# Patient Record
Sex: Female | Born: 1995 | Race: White | Hispanic: No | Marital: Single | Smoking: Never smoker
Health system: Southern US, Community
[De-identification: ages and names within clinical notes are randomized; demographics above are authoritative.]

## PROBLEM LIST (undated history)

## (undated) DIAGNOSIS — K219 Gastro-esophageal reflux disease without esophagitis: Secondary | ICD-10-CM

## (undated) DIAGNOSIS — F909 Attention-deficit hyperactivity disorder, unspecified type: Secondary | ICD-10-CM

## (undated) DIAGNOSIS — G90A Postural orthostatic tachycardia syndrome (POTS): Secondary | ICD-10-CM

## (undated) DIAGNOSIS — R000 Tachycardia, unspecified: Principal | ICD-10-CM

## (undated) DIAGNOSIS — J45909 Unspecified asthma, uncomplicated: Secondary | ICD-10-CM

## (undated) DIAGNOSIS — I498 Other specified cardiac arrhythmias: Secondary | ICD-10-CM

## (undated) DIAGNOSIS — I951 Orthostatic hypotension: Principal | ICD-10-CM

## (undated) DIAGNOSIS — Z8742 Personal history of other diseases of the female genital tract: Secondary | ICD-10-CM

## (undated) DIAGNOSIS — F329 Major depressive disorder, single episode, unspecified: Secondary | ICD-10-CM

## (undated) DIAGNOSIS — F419 Anxiety disorder, unspecified: Secondary | ICD-10-CM

## (undated) HISTORY — DX: Major depressive disorder, single episode, unspecified: F32.9

## (undated) HISTORY — DX: Gastro-esophageal reflux disease without esophagitis: K21.9

## (undated) HISTORY — DX: Orthostatic hypotension: I95.1

## (undated) HISTORY — DX: Unspecified asthma, uncomplicated: J45.909

## (undated) HISTORY — DX: Other specified cardiac arrhythmias: I49.8

## (undated) HISTORY — DX: Attention-deficit hyperactivity disorder, unspecified type: F90.9

## (undated) HISTORY — DX: Anxiety disorder, unspecified: F41.9

## (undated) HISTORY — DX: Postural orthostatic tachycardia syndrome (POTS): G90.A

## (undated) HISTORY — DX: Personal history of other diseases of the female genital tract: Z87.42

## (undated) HISTORY — DX: Tachycardia, unspecified: R00.0

---
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
## 2016-02-15 DIAGNOSIS — R300 Dysuria: Secondary | ICD-10-CM | POA: Diagnosis not present

## 2016-02-15 DIAGNOSIS — N390 Urinary tract infection, site not specified: Secondary | ICD-10-CM | POA: Diagnosis not present

## 2016-02-15 DIAGNOSIS — R319 Hematuria, unspecified: Secondary | ICD-10-CM | POA: Diagnosis not present

## 2016-03-27 DIAGNOSIS — K529 Noninfective gastroenteritis and colitis, unspecified: Secondary | ICD-10-CM | POA: Diagnosis not present

## 2016-04-14 DIAGNOSIS — J069 Acute upper respiratory infection, unspecified: Secondary | ICD-10-CM | POA: Diagnosis not present

## 2016-04-14 DIAGNOSIS — R05 Cough: Secondary | ICD-10-CM | POA: Diagnosis not present

## 2016-05-18 DIAGNOSIS — Z23 Encounter for immunization: Secondary | ICD-10-CM | POA: Diagnosis not present

## 2016-05-18 DIAGNOSIS — Z0000 Encounter for general adult medical examination without abnormal findings: Secondary | ICD-10-CM | POA: Diagnosis not present

## 2016-05-18 DIAGNOSIS — J329 Chronic sinusitis, unspecified: Secondary | ICD-10-CM | POA: Diagnosis not present

## 2016-05-22 ENCOUNTER — Emergency Department (HOSPITAL_COMMUNITY)
Admission: EM | Admit: 2016-05-22 | Discharge: 2016-05-22 | Disposition: A | Discharge status: Home / Self Care | Payer: BLUE CROSS/BLUE SHIELD | Attending: Emergency Medicine | Admitting: Emergency Medicine

## 2016-05-22 ENCOUNTER — Encounter (HOSPITAL_COMMUNITY): Payer: Self-pay | Admitting: Pharmacy Technician

## 2016-05-22 ENCOUNTER — Emergency Department (HOSPITAL_COMMUNITY): Payer: BLUE CROSS/BLUE SHIELD

## 2016-05-22 DIAGNOSIS — Y92410 Unspecified street and highway as the place of occurrence of the external cause: Secondary | ICD-10-CM | POA: Insufficient documentation

## 2016-05-22 DIAGNOSIS — F909 Attention-deficit hyperactivity disorder, unspecified type: Secondary | ICD-10-CM | POA: Insufficient documentation

## 2016-05-22 DIAGNOSIS — M545 Low back pain: Secondary | ICD-10-CM | POA: Diagnosis not present

## 2016-05-22 DIAGNOSIS — R102 Pelvic and perineal pain: Secondary | ICD-10-CM | POA: Diagnosis not present

## 2016-05-22 DIAGNOSIS — S60512A Abrasion of left hand, initial encounter: Secondary | ICD-10-CM | POA: Insufficient documentation

## 2016-05-22 DIAGNOSIS — S60511A Abrasion of right hand, initial encounter: Secondary | ICD-10-CM | POA: Insufficient documentation

## 2016-05-22 DIAGNOSIS — Y999 Unspecified external cause status: Secondary | ICD-10-CM | POA: Insufficient documentation

## 2016-05-22 DIAGNOSIS — M546 Pain in thoracic spine: Secondary | ICD-10-CM | POA: Diagnosis not present

## 2016-05-22 DIAGNOSIS — S0990XA Unspecified injury of head, initial encounter: Secondary | ICD-10-CM | POA: Diagnosis present

## 2016-05-22 DIAGNOSIS — S0081XA Abrasion of other part of head, initial encounter: Secondary | ICD-10-CM | POA: Diagnosis not present

## 2016-05-22 DIAGNOSIS — Y939 Activity, unspecified: Secondary | ICD-10-CM | POA: Diagnosis not present

## 2016-05-22 LAB — POC URINE PREG, ED: PREG TEST UR: NEGATIVE

## 2016-05-22 MED ORDER — IBUPROFEN 400 MG PO TABS: 400.0000 mg | ORAL_TABLET | Freq: Four times a day (QID) | ORAL | 0 refills | Status: DC | PRN

## 2016-05-22 MED ORDER — KETOROLAC TROMETHAMINE 60 MG/2ML IM SOLN
60.0000 mg | Freq: Once | INTRAMUSCULAR | Status: AC
  Administered 2016-05-22: 60 mg via INTRAMUSCULAR
  Filled 2016-05-22: qty 2

## 2016-05-22 MED ORDER — HYDROCODONE-ACETAMINOPHEN 5-325 MG PO TABS
1.0000 | ORAL_TABLET | Freq: Once | ORAL | Status: AC
  Administered 2016-05-22: 1 via ORAL
  Filled 2016-05-22: qty 1

## 2016-05-22 NOTE — ED Notes (Signed)
Patient has returned from being out of the department; patient placed back on continuous pulse oximetry and blood pressure cuff; visitors at bedside 
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
## 2016-06-08 DIAGNOSIS — R1900 Intra-abdominal and pelvic swelling, mass and lump, unspecified site: Secondary | ICD-10-CM | POA: Diagnosis not present

## 2016-09-18 DIAGNOSIS — M67442 Ganglion, left hand: Secondary | ICD-10-CM | POA: Diagnosis not present

## 2016-09-26 DIAGNOSIS — Z6834 Body mass index (BMI) 34.0-34.9, adult: Secondary | ICD-10-CM | POA: Diagnosis not present

## 2016-09-26 DIAGNOSIS — R875 Abnormal microbiological findings in specimens from female genital organs: Secondary | ICD-10-CM | POA: Diagnosis not present

## 2016-09-26 DIAGNOSIS — Z01419 Encounter for gynecological examination (general) (routine) without abnormal findings: Secondary | ICD-10-CM | POA: Diagnosis not present

## 2016-10-04 DIAGNOSIS — M67442 Ganglion, left hand: Secondary | ICD-10-CM | POA: Diagnosis not present

## 2016-10-16 DIAGNOSIS — L259 Unspecified contact dermatitis, unspecified cause: Secondary | ICD-10-CM | POA: Diagnosis not present

## 2016-10-16 DIAGNOSIS — J069 Acute upper respiratory infection, unspecified: Secondary | ICD-10-CM | POA: Diagnosis not present

## 2016-10-29 ENCOUNTER — Other Ambulatory Visit: Payer: Self-pay

## 2016-10-29 DIAGNOSIS — L729 Follicular cyst of the skin and subcutaneous tissue, unspecified: Secondary | ICD-10-CM | POA: Diagnosis not present

## 2016-10-29 DIAGNOSIS — M67442 Ganglion, left hand: Secondary | ICD-10-CM | POA: Diagnosis not present

## 2016-11-05 DIAGNOSIS — N920 Excessive and frequent menstruation with regular cycle: Secondary | ICD-10-CM | POA: Diagnosis not present

## 2016-11-05 DIAGNOSIS — R21 Rash and other nonspecific skin eruption: Secondary | ICD-10-CM | POA: Diagnosis not present

## 2016-12-19 DIAGNOSIS — L409 Psoriasis, unspecified: Secondary | ICD-10-CM | POA: Diagnosis not present

## 2016-12-31 DIAGNOSIS — Z79899 Other long term (current) drug therapy: Secondary | ICD-10-CM | POA: Diagnosis not present

## 2017-02-04 DIAGNOSIS — L409 Psoriasis, unspecified: Secondary | ICD-10-CM | POA: Diagnosis not present

## 2017-03-11 DIAGNOSIS — M546 Pain in thoracic spine: Secondary | ICD-10-CM | POA: Diagnosis not present

## 2017-03-21 DIAGNOSIS — R0781 Pleurodynia: Secondary | ICD-10-CM | POA: Diagnosis not present

## 2017-03-21 DIAGNOSIS — M62830 Muscle spasm of back: Secondary | ICD-10-CM | POA: Diagnosis not present

## 2017-04-04 DIAGNOSIS — R110 Nausea: Secondary | ICD-10-CM | POA: Diagnosis not present

## 2017-04-04 DIAGNOSIS — N926 Irregular menstruation, unspecified: Secondary | ICD-10-CM | POA: Diagnosis not present

## 2017-04-08 DIAGNOSIS — N926 Irregular menstruation, unspecified: Secondary | ICD-10-CM | POA: Diagnosis not present

## 2017-04-11 DIAGNOSIS — R3915 Urgency of urination: Secondary | ICD-10-CM | POA: Diagnosis not present

## 2017-04-11 DIAGNOSIS — N939 Abnormal uterine and vaginal bleeding, unspecified: Secondary | ICD-10-CM | POA: Diagnosis not present

## 2017-04-11 DIAGNOSIS — R632 Polyphagia: Secondary | ICD-10-CM | POA: Diagnosis not present

## 2017-05-01 DIAGNOSIS — Z113 Encounter for screening for infections with a predominantly sexual mode of transmission: Secondary | ICD-10-CM | POA: Diagnosis not present

## 2017-05-01 DIAGNOSIS — N938 Other specified abnormal uterine and vaginal bleeding: Secondary | ICD-10-CM | POA: Diagnosis not present

## 2017-05-01 DIAGNOSIS — Z3202 Encounter for pregnancy test, result negative: Secondary | ICD-10-CM | POA: Diagnosis not present

## 2017-05-01 DIAGNOSIS — N898 Other specified noninflammatory disorders of vagina: Secondary | ICD-10-CM | POA: Diagnosis not present

## 2017-05-09 DIAGNOSIS — R110 Nausea: Secondary | ICD-10-CM | POA: Diagnosis not present

## 2017-05-09 DIAGNOSIS — R109 Unspecified abdominal pain: Secondary | ICD-10-CM | POA: Diagnosis not present

## 2017-05-21 DIAGNOSIS — J069 Acute upper respiratory infection, unspecified: Secondary | ICD-10-CM | POA: Diagnosis not present

## 2017-05-24 DIAGNOSIS — R300 Dysuria: Secondary | ICD-10-CM | POA: Diagnosis not present

## 2017-05-24 DIAGNOSIS — R110 Nausea: Secondary | ICD-10-CM | POA: Diagnosis not present

## 2017-06-27 DIAGNOSIS — J029 Acute pharyngitis, unspecified: Secondary | ICD-10-CM | POA: Diagnosis not present

## 2017-08-01 DIAGNOSIS — M722 Plantar fascial fibromatosis: Secondary | ICD-10-CM | POA: Diagnosis not present

## 2017-08-01 DIAGNOSIS — M79672 Pain in left foot: Secondary | ICD-10-CM | POA: Diagnosis not present

## 2017-08-01 DIAGNOSIS — M79671 Pain in right foot: Secondary | ICD-10-CM | POA: Diagnosis not present

## 2017-08-06 DIAGNOSIS — K2900 Acute gastritis without bleeding: Secondary | ICD-10-CM | POA: Diagnosis not present

## 2017-08-07 ENCOUNTER — Other Ambulatory Visit: Payer: Self-pay | Admitting: Pediatrics

## 2017-08-07 DIAGNOSIS — R110 Nausea: Secondary | ICD-10-CM | POA: Diagnosis not present

## 2017-08-07 DIAGNOSIS — R197 Diarrhea, unspecified: Secondary | ICD-10-CM | POA: Diagnosis not present

## 2017-08-07 DIAGNOSIS — R109 Unspecified abdominal pain: Secondary | ICD-10-CM | POA: Diagnosis not present

## 2017-08-07 DIAGNOSIS — S2096XA Insect bite (nonvenomous) of unspecified parts of thorax, initial encounter: Secondary | ICD-10-CM | POA: Diagnosis not present

## 2017-08-08 ENCOUNTER — Other Ambulatory Visit: Payer: Self-pay | Admitting: Internal Medicine

## 2017-08-08 DIAGNOSIS — R109 Unspecified abdominal pain: Secondary | ICD-10-CM

## 2017-08-08 DIAGNOSIS — R1011 Right upper quadrant pain: Secondary | ICD-10-CM

## 2017-08-12 ENCOUNTER — Other Ambulatory Visit: Payer: BLUE CROSS/BLUE SHIELD

## 2017-08-12 DIAGNOSIS — R1011 Right upper quadrant pain: Secondary | ICD-10-CM | POA: Diagnosis not present

## 2017-08-12 DIAGNOSIS — K30 Functional dyspepsia: Secondary | ICD-10-CM | POA: Diagnosis not present

## 2017-08-12 DIAGNOSIS — R197 Diarrhea, unspecified: Secondary | ICD-10-CM | POA: Diagnosis not present

## 2017-08-20 ENCOUNTER — Ambulatory Visit
Admission: RE | Admit: 2017-08-20 | Discharge: 2017-08-20 | Disposition: A | Discharge status: Home / Self Care | Payer: BLUE CROSS/BLUE SHIELD | Source: Ambulatory Visit | Attending: Internal Medicine | Admitting: Internal Medicine

## 2017-08-20 DIAGNOSIS — R1012 Left upper quadrant pain: Secondary | ICD-10-CM | POA: Diagnosis not present

## 2017-08-20 DIAGNOSIS — R109 Unspecified abdominal pain: Secondary | ICD-10-CM

## 2017-08-22 DIAGNOSIS — R197 Diarrhea, unspecified: Secondary | ICD-10-CM | POA: Diagnosis not present

## 2017-08-22 DIAGNOSIS — R110 Nausea: Secondary | ICD-10-CM | POA: Diagnosis not present

## 2017-08-22 DIAGNOSIS — R1013 Epigastric pain: Secondary | ICD-10-CM | POA: Diagnosis not present

## 2017-09-22 DIAGNOSIS — J00 Acute nasopharyngitis [common cold]: Secondary | ICD-10-CM | POA: Diagnosis not present

## 2017-10-16 DIAGNOSIS — Z6834 Body mass index (BMI) 34.0-34.9, adult: Secondary | ICD-10-CM | POA: Diagnosis not present

## 2017-10-16 DIAGNOSIS — Z01419 Encounter for gynecological examination (general) (routine) without abnormal findings: Secondary | ICD-10-CM | POA: Diagnosis not present

## 2017-10-16 DIAGNOSIS — Z113 Encounter for screening for infections with a predominantly sexual mode of transmission: Secondary | ICD-10-CM | POA: Diagnosis not present

## 2017-12-17 DIAGNOSIS — T63441A Toxic effect of venom of bees, accidental (unintentional), initial encounter: Secondary | ICD-10-CM | POA: Diagnosis not present

## 2017-12-21 DIAGNOSIS — A084 Viral intestinal infection, unspecified: Secondary | ICD-10-CM | POA: Diagnosis not present

## 2018-01-08 DIAGNOSIS — J069 Acute upper respiratory infection, unspecified: Secondary | ICD-10-CM | POA: Diagnosis not present

## 2018-02-04 DIAGNOSIS — R109 Unspecified abdominal pain: Secondary | ICD-10-CM | POA: Diagnosis not present

## 2018-02-11 DIAGNOSIS — Z131 Encounter for screening for diabetes mellitus: Secondary | ICD-10-CM | POA: Diagnosis not present

## 2018-02-11 DIAGNOSIS — R000 Tachycardia, unspecified: Secondary | ICD-10-CM | POA: Diagnosis not present

## 2018-02-11 DIAGNOSIS — I951 Orthostatic hypotension: Secondary | ICD-10-CM | POA: Diagnosis not present

## 2018-02-11 DIAGNOSIS — R109 Unspecified abdominal pain: Secondary | ICD-10-CM | POA: Diagnosis not present

## 2018-02-11 DIAGNOSIS — R42 Dizziness and giddiness: Secondary | ICD-10-CM | POA: Diagnosis not present

## 2018-02-11 DIAGNOSIS — K929 Disease of digestive system, unspecified: Secondary | ICD-10-CM | POA: Diagnosis not present

## 2018-03-05 ENCOUNTER — Encounter: Payer: Self-pay | Admitting: *Deleted

## 2018-03-11 ENCOUNTER — Encounter: Payer: Self-pay | Admitting: *Deleted

## 2018-03-11 ENCOUNTER — Ambulatory Visit: Payer: BLUE CROSS/BLUE SHIELD | Admitting: Cardiology

## 2018-03-11 ENCOUNTER — Other Ambulatory Visit: Payer: Self-pay | Admitting: *Deleted

## 2018-03-11 ENCOUNTER — Encounter (INDEPENDENT_AMBULATORY_CARE_PROVIDER_SITE_OTHER): Payer: Self-pay

## 2018-03-11 VITALS — BP 125/90 | HR 64 | Ht 64.0 in | Wt 201.0 lb

## 2018-03-11 DIAGNOSIS — R000 Tachycardia, unspecified: Secondary | ICD-10-CM

## 2018-03-11 DIAGNOSIS — I951 Orthostatic hypotension: Secondary | ICD-10-CM

## 2018-03-11 DIAGNOSIS — G90A Postural orthostatic tachycardia syndrome (POTS): Secondary | ICD-10-CM

## 2018-03-11 MED ORDER — FLUDROCORTISONE ACETATE 0.1 MG PO TABS: 0.2000 mg | ORAL_TABLET | Freq: Every day | ORAL | 3 refills | Status: DC

## 2018-03-11 MED ORDER — PROPRANOLOL HCL 60 MG PO TABS: 60.0000 mg | ORAL_TABLET | Freq: Two times a day (BID) | ORAL | 3 refills | Status: DC

## 2018-03-11 NOTE — Addendum Note (Signed)
Addended by: Baird LyonsPRICE, Tiye Huwe L on: 03/11/2018 09:51 AM   Modules accepted: Orders

## 2018-03-11 NOTE — Patient Instructions (Signed)
Medication Instructions:  Your physician has recommended you make the following change in your medication:  1. START Florinef 0.2 mg once a day   (you will take two 0.1 mg tablets) 2. START Propranolol 60 mg twice a day  If you need a refill on your cardiac medications before your next appointment, please call your pharmacy.   Lab work: None ordered  Testing/Procedures: None ordered  Follow-Up: At [facility], you and your health needs are our priority.  As part of our continuing mission to provide you with exceptional heart care, we have created designated Provider Care Teams.  These Care Teams include your primary Cardiologist (physician) and Advanced Practice Providers (APPs -  Physician Assistants and Nurse Practitioners) who all work together to provide you with the care you need, when you need it. . You will need a follow up appointment in 3 months with Dr. Elberta Fortis.  Thank you for choosing [facility]!!   Dory Horn, RN [phone]   Any Other Special Instructions Will Be Listed Below (If Applicable).  Postural Orthostatic Tachycardia Syndrome Postural orthostatic tachycardia syndrome (POTS) is a group of symptoms that can occur when you stand up after lying down. POTS happens when less blood flows to the heart than normal after you stand up. The reduced blood flow to the heart makes the heart beat rapidly. POTS may be associated with another medical condition, or it may occur on its own. What are the causes? This cause of this condition is not known, but many conditions and diseases have been linked to it. What increases the risk? This condition is more likely to develop in:  Women 41-57 years old.  Women who are pregnant.  Women who are menstruating.  People who have certain conditions, including: ? A viral infection. ? An autoimmune disease. ? Anemia. ? Dehydration. ? Hyperthyroidism.  People who take certain medicines.  People who have had a major  injury.  People who have had surgery.  What are the signs or symptoms? The most common symptom of this condition is lightheadedness after standing up from a lying down position. Other symptoms may include:  Feeling a rapid increase in the speed of the heartbeat (tachycardia) within 10 minutes of standing up.  Fainting.  Weakness.  Confusion.  Trembling.  Shortness of breath.  Sweating or flushing.  Headache.  Chest pain.  Breathing that is deeper and faster than normal (hyperventilation).  Nausea.  Anxiety.  Symptoms may be worse in the morning, and they may be relieved by lying down. How is this diagnosed? This condition is diagnosed based on:  Your symptoms.  Your medical history.  A physical exam.  Measurements of your heart rate when you are lying down and after you stand up.  A measurement of your blood pressure. The measurement will be taken when you go from lying down to standing up.  Blood tests to measure hormones that change with blood pressure. The blood tests will be done when you are lying down and standing up.  You may have other tests to check whether you have a condition or disease that is linked to POTS. How is this treated? Treatment for this condition depends on how severe your symptoms are and whether you have any conditions or diseases that have been linked to POTS. Treatment may involve:  Treating any conditions or diseases that have been linked to POTS.  Drinking two glasses of water before getting up from a lying position.  Eating more salt (sodium).  Taking medicine to control blood pressure and heart rate (beta-blocker).  Taking medicines to control blood flow, blood pressure, or heart rate.  Avoiding certain medicines.  Starting an exercise program under the supervision of your health care provider.  Follow these instructions at home:  Eating and drinking  Drink enough fluid to keep your urine clear or pale yellow.  If  told by your health care provider, drink two glasses of water before getting up from a lying position.  Follow instructions from your health care provider about how much sodium you should eat.  Eat several small meals a day instead of a few large meals.  Avoid heavy meals. Medicines  Take over-the-counter and prescription medicines only as told by your health care provider.  Talk with your health care provider before starting any new medicines. Activity  Do an aerobic exercise for 20 minutes a day, at least 3 days a week.  Ask your health care provider what kinds of exercise are safe for you. Contact a health care provider if:  Your symptoms do not improve after treatment.  Your symptoms get worse.  You develop new symptoms. Get help right away if:  You have chest pain.  You have difficulty breathing.  You have fainting episodes. This information is not intended to replace advice given to you by your health care provider. Make sure you discuss any questions you have with your health care provider. Document Released: 03/30/2002 Document Revised: 05/18/2015 Document Reviewed: 10/22/2014 Elsevier Interactive Patient Education  [employer]2018 [employer]

## 2018-03-11 NOTE — Progress Notes (Signed)
Electrophysiology Office Note   Date:  03/11/2018   ID:  Abigail Alvarado, DOB 16-Mar-1996, MRN [id number]  PCP:  Heide Scales, PA-C  Cardiologist:   Primary Electrophysiologist:  Tondalaya Perren Jorja Loa, MD    No chief complaint on file.    History of Present Illness: Abigail Alvarado is a 22 y.o. female who is being seen today for the evaluation of POTS at the request of Toy Cookey. Presenting today for electrophysiology evaluation.  She has a history of ADHD, anxiety, pots.  She has had pots for many years.  She stopped taking her medications a few years ago.  Over the past few months her pots is been getting worse and then interfering with her work.  She has symptoms of heart racing and blood pressure dropping.  She previously saw a pediatric cardiologist but has not seen anyone since she was 22 years old.  That being said, she had a tilt table test and was put on Florinef.  She was also put on propranolol.  Increased fluid intake was encouraged she is encouraged not to skip meals, eliminate caffeine and antigravity maneuvers.  Felt better for a few years, but her symptoms came back.  She has pots symptoms once or twice a week.  She did have syncope or near syncope while at work.  Today, she denies symptoms of palpitations, chest pain, shortness of breath, orthopnea, PND, lower extremity edema, claudication, dizziness, presyncope, syncope, bleeding, or neurologic sequela. The patient is tolerating medications without difficulties.    Past Medical History:  Diagnosis Date  . ADHD   . ADHD (attention deficit hyperactivity disorder)   . Anxiety   . Asthma   . GERD (gastroesophageal reflux disease)   . H/O menorrhagia   . H/O metrorrhagia   . POTS (postural orthostatic tachycardia syndrome)   . Reactive depression    History reviewed. No pertinent surgical history.   Current Outpatient Medications  Medication Sig Dispense Refill  . Budesonide (PULMICORT FLEXHALER) 90  MCG/ACT inhaler as directed.    . fluticasone (FLONASE) 50 MCG/ACT nasal spray as directed.    . norgestimate-ethinyl estradiol (ORTHO-CYCLEN,SPRINTEC,PREVIFEM) 0.25-35 MG-MCG tablet Take 1 tablet by mouth daily.    Marland Kitchen PROAIR HFA 108 (90 BASE) MCG/ACT inhaler      No current facility-administered medications for this visit.     Allergies:   Delsym cough relief  [dextromethorphan-menthol]; Ondansetron hcl; Guaifenesin; Zofran [ondansetron]; Dextromethorphan; and Dextromethorphan hbr   Social History:  The patient  reports that she has never smoked. She has never used smokeless tobacco. She reports that she does not drink alcohol or use drugs.   Family History:  The patient's family history includes Anxiety disorder in her mother and paternal grandmother; Dementia in her maternal grandfather.    ROS:  Please see the history of present illness.   Otherwise, review of systems is positive for shortness of breath, palpitations, cough, nausea, anxiety, back pain, balance problems, dizziness, passing out, headaches.   All other systems are reviewed and negative.    PHYSICAL EXAM: VS:  BP 125/90   Pulse 64   Ht 5\' 4"  (1.626 m)   Wt 201 lb (91.2 kg)   BMI 34.50 kg/m  , BMI Body mass index is 34.5 kg/m. GEN: Well nourished, well developed, in no acute distress  HEENT: normal  Neck: no JVD, carotid bruits, or masses Cardiac: RRR; no murmurs, rubs, or gallops,no edema  Respiratory:  clear to auscultation bilaterally, normal work of breathing  GI: soft, nontender, nondistended, + BS MS: no deformity or atrophy  Skin: warm and dry Neuro:  Strength and sensation are intact Psych: euthymic mood, full affect  EKG:  EKG is ordered today. Personal review of the ekg ordered shows sinus arrhythmia, rate 65  Recent Labs: No results found for requested labs within last 8760 hours.   Orthostatic VS for the past 24 hrs:  BP- Lying Pulse- Lying BP- Sitting Pulse- Sitting BP- Standing at 0 minutes  Pulse- Standing at 0 minutes  03/11/18 0922 125/90 64 (!) 136/93 80 137/86 95    Lipid Panel  No results found for: CHOL, TRIG, HDL, CHOLHDL, VLDL, LDLCALC, LDLDIRECT   Wt Readings from Last 3 Encounters:  03/11/18 201 lb (91.2 kg)  05/22/16 218 lb (98.9 kg)      Other studies Reviewed: Additional studies/ records that were reviewed today include: PCP notes    ASSESSMENT AND PLAN:  1.  POTS: This is a prior diagnosis.  She has not been on medications for quite a while.  She is quite symptomatic at times from her pots.  She is lost to follow-up and thus not on any of her prior medications.  She does say that she felt like the propranolol and Florinef were helping her previously.  Restart those medications.  Should she continue to have symptoms, Manford Sprong titrate those medications as needed.    Current medicines are reviewed at length with the patient today.   The patient does not have concerns regarding her medicines.  The following changes were made today:  none  Labs/ tests ordered today include:  Orders Placed This Encounter  Procedures  . EKG 12-Lead     Disposition:   FU with [place] 3 months  Signed, Anaih Brander Jorja LoaMartin Kimmi Acocella, MD  03/11/2018 9:38 AM     [facility] [address] [place] [id number] [phone] (office) [phone] (fax)

## 2018-03-18 ENCOUNTER — Telehealth: Payer: Self-pay | Admitting: Cardiology

## 2018-03-18 NOTE — Telephone Encounter (Signed)
Advised to give medication some more time and to continue to monitor. Pt will call back if symptoms do not improve. She appreciates the call back

## 2018-03-18 NOTE — Telephone Encounter (Signed)
New message    Pt c/o medication issue:  1. Name of Medication: fludrocortisone, propranolol  2. How are you currently taking this medication (dosage and times per day)? Daily   3. Are you having a reaction (difficulty breathing--STAT)? no  4. What is your medication issue? Pt states that she doesn't think the medication is working. She said she got sick last night at work. She said should she try something else or give it more time to work.

## 2018-03-27 ENCOUNTER — Institutional Professional Consult (permissible substitution): Payer: BLUE CROSS/BLUE SHIELD | Admitting: Internal Medicine

## 2018-04-25 ENCOUNTER — Other Ambulatory Visit: Payer: Self-pay | Admitting: *Deleted

## 2018-04-25 MED ORDER — FLUDROCORTISONE ACETATE 0.1 MG PO TABS: 0.2000 mg | ORAL_TABLET | Freq: Every day | ORAL | 0 refills | Status: DC

## 2018-04-25 MED ORDER — PROPRANOLOL HCL 60 MG PO TABS: 60.0000 mg | ORAL_TABLET | Freq: Two times a day (BID) | ORAL | 0 refills | Status: DC

## 2018-06-17 ENCOUNTER — Ambulatory Visit: Payer: BLUE CROSS/BLUE SHIELD | Admitting: Cardiology

## 2018-06-17 ENCOUNTER — Encounter: Payer: Self-pay | Admitting: Cardiology

## 2018-06-17 VITALS — BP 124/80 | HR 78 | Ht 64.0 in | Wt 208.0 lb

## 2018-06-17 DIAGNOSIS — I951 Orthostatic hypotension: Secondary | ICD-10-CM | POA: Diagnosis not present

## 2018-06-17 DIAGNOSIS — R000 Tachycardia, unspecified: Secondary | ICD-10-CM | POA: Diagnosis not present

## 2018-06-17 DIAGNOSIS — G90A Postural orthostatic tachycardia syndrome (POTS): Secondary | ICD-10-CM

## 2018-06-17 NOTE — Patient Instructions (Addendum)
Medication Instructions:  Your physician recommends that you continue on your current medications as directed. Please refer to the Current Medication list given to you today.  * If you need a refill on your cardiac medications before your next appointment, please call your pharmacy.   Labwork: None ordered  Testing/Procedures: None ordered  Follow-Up: Your physician wants you to follow-up in: 6 months with Dr. Camnitz.  You will receive a reminder letter in the mail two months in advance. If you don't receive a letter, please call our office to schedule the follow-up appointment.  Thank you for choosing [facility]!!   Johanny Segers, RN [phone]      r 

## 2018-06-17 NOTE — Progress Notes (Signed)
Electrophysiology Office Note   Date:  06/17/2018   ID:  Abigail Alvarado, DOB 1996/01/01, MRN [id number]  PCP:  Heide Scales, PA-C  Cardiologist:   Primary Electrophysiologist:  Tajah Noguchi Jorja Loa, MD    No chief complaint on file.    History of Present Illness: Abigail Alvarado is a 23 y.o. female who is being seen today for the evaluation of POTS at the request of Toy Cookey. Presenting today for electrophysiology evaluation.  She has a history of ADHD, anxiety, pots.  She has had pots for many years.  She stopped taking her medications a few years ago.  Over the past few months her pots is been getting worse and then interfering with her work.  She has symptoms of heart racing and blood pressure dropping.  She previously saw a pediatric cardiologist but has not seen anyone since she was 23 years old.  That being said, she had a tilt table test and was put on Florinef.  She was also put on propranolol.  Increased fluid intake was encouraged she is encouraged not to skip meals, eliminate caffeine and antigravity maneuvers.  Felt better for a few years, but her symptoms came back.  She has pots symptoms once or twice a week.  She did have syncope or near syncope while at work.  Today, denies symptoms of palpitations, chest pain, shortness of breath, orthopnea, PND, lower extremity edema, claudication, dizziness, presyncope, syncope, bleeding, or neurologic sequela. The patient is tolerating medications without difficulties.  Overall she is doing well.  She has had no further episodes of syncope or near syncope while at work.  She has been getting nauseous with abdominal pain while at work.  This occurs either on a weekly or biweekly basis.  There are no exacerbating or alleviating factors.   Past Medical History:  Diagnosis Date  . ADHD   . ADHD (attention deficit hyperactivity disorder)   . Anxiety   . Asthma   . GERD (gastroesophageal reflux disease)   . H/O  menorrhagia   . H/O metrorrhagia   . POTS (postural orthostatic tachycardia syndrome)   . Reactive depression    History reviewed. No pertinent surgical history.   Current Outpatient Medications  Medication Sig Dispense Refill  . Budesonide (PULMICORT FLEXHALER) 90 MCG/ACT inhaler as directed.    . fludrocortisone (FLORINEF) 0.1 MG tablet Take 2 tablets (0.2 mg total) by mouth daily. 180 tablet 0  . fluticasone (FLONASE) 50 MCG/ACT nasal spray as directed.    . norgestimate-ethinyl estradiol (ORTHO-CYCLEN,SPRINTEC,PREVIFEM) 0.25-35 MG-MCG tablet Take 1 tablet by mouth daily.    Marland Kitchen PROAIR HFA 108 (90 BASE) MCG/ACT inhaler     . propranolol (INDERAL) 60 MG tablet Take 1 tablet (60 mg total) by mouth 2 (two) times daily. 180 tablet 0   No current facility-administered medications for this visit.     Allergies:   Delsym cough relief  [dextromethorphan-menthol]; Ondansetron hcl; Guaifenesin; Zofran [ondansetron]; Dextromethorphan; and Dextromethorphan hbr   Social History:  The patient  reports that she has never smoked. She has never used smokeless tobacco. She reports that she does not drink alcohol or use drugs.   Family History:  The patient's family history includes Anxiety disorder in her mother and paternal grandmother; Dementia in her maternal grandfather.    ROS:  Please see the history of present illness.   Otherwise, review of systems is positive for dizziness.   All other systems are reviewed and negative.   PHYSICAL EXAM:  VS:  BP 124/80   Pulse 78   Ht 5\' 4"  (1.626 m)   Wt 208 lb (94.3 kg)   BMI 35.70 kg/m  , BMI Body mass index is 35.7 kg/m. GEN: Well nourished, well developed, in no acute distress  HEENT: normal  Neck: no JVD, carotid bruits, or masses Cardiac: RRR; no murmurs, rubs, or gallops,no edema  Respiratory:  clear to auscultation bilaterally, normal work of breathing GI: soft, nontender, nondistended, + BS MS: no deformity or atrophy  Skin: warm and  dry Neuro:  Strength and sensation are intact Psych: euthymic mood, full affect  EKG:  EKG is ordered today. Personal review of the ekg ordered shows sinus rhythm, rate 78  Recent Labs: No results found for requested labs within last 8760 hours.   No data found.  Lipid Panel  No results found for: CHOL, TRIG, HDL, CHOLHDL, VLDL, LDLCALC, LDLDIRECT   Wt Readings from Last 3 Encounters:  06/17/18 208 lb (94.3 kg)  03/11/18 201 lb (91.2 kg)  05/22/16 218 lb (98.9 kg)      Other studies Reviewed: Additional studies/ records that were reviewed today include: PCP notes    ASSESSMENT AND PLAN:  1.  POTS: Is a prior diagnosis.  She is currently on Florinef and propranolol.  She is noted no further episodes of significant tachycardia.  She is having some nausea and abdominal pain that occurs at random times on a weekly to biweekly basis.  I have asked her to discuss this with her primary physician as at this point it does not sound to be related to pots.    Current medicines are reviewed at length with the patient today.   The patient does not have concerns regarding her medicines.  The following changes were made today: None  Labs/ tests ordered today include:  Orders Placed This Encounter  Procedures  . EKG 12-Lead     Disposition:   FU with [place] 6 months  Signed, Kaya Klausing Jorja Loa, MD  06/17/2018 11:33 AM     [facility] [address] [place] [address] [phone] (office) [phone] (fax)

## 2018-07-03 DIAGNOSIS — I498 Other specified cardiac arrhythmias: Secondary | ICD-10-CM | POA: Diagnosis not present

## 2018-07-03 DIAGNOSIS — R110 Nausea: Secondary | ICD-10-CM | POA: Diagnosis not present

## 2018-07-03 DIAGNOSIS — K3184 Gastroparesis: Secondary | ICD-10-CM | POA: Diagnosis not present

## 2018-07-08 ENCOUNTER — Other Ambulatory Visit: Payer: Self-pay | Admitting: Cardiology
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
## 2018-09-23 DIAGNOSIS — L309 Dermatitis, unspecified: Secondary | ICD-10-CM | POA: Diagnosis not present

## 2018-09-25 DIAGNOSIS — L259 Unspecified contact dermatitis, unspecified cause: Secondary | ICD-10-CM | POA: Diagnosis not present

## 2018-10-22 DIAGNOSIS — Z01419 Encounter for gynecological examination (general) (routine) without abnormal findings: Secondary | ICD-10-CM | POA: Diagnosis not present

## 2018-10-22 DIAGNOSIS — Z3202 Encounter for pregnancy test, result negative: Secondary | ICD-10-CM | POA: Diagnosis not present

## 2018-10-22 DIAGNOSIS — Z6837 Body mass index (BMI) 37.0-37.9, adult: Secondary | ICD-10-CM | POA: Diagnosis not present

## 2018-10-22 DIAGNOSIS — Z113 Encounter for screening for infections with a predominantly sexual mode of transmission: Secondary | ICD-10-CM | POA: Diagnosis not present

## 2018-10-25 DIAGNOSIS — R300 Dysuria: Secondary | ICD-10-CM | POA: Diagnosis not present

## 2018-11-07 DIAGNOSIS — R300 Dysuria: Secondary | ICD-10-CM | POA: Diagnosis not present

## 2019-03-12 DIAGNOSIS — N390 Urinary tract infection, site not specified: Secondary | ICD-10-CM | POA: Diagnosis not present

## 2019-03-12 DIAGNOSIS — R5383 Other fatigue: Secondary | ICD-10-CM | POA: Diagnosis not present

## 2019-03-12 DIAGNOSIS — Z331 Pregnant state, incidental: Secondary | ICD-10-CM | POA: Diagnosis not present

## 2019-03-30 DIAGNOSIS — K529 Noninfective gastroenteritis and colitis, unspecified: Secondary | ICD-10-CM | POA: Diagnosis not present

## 2019-03-30 DIAGNOSIS — Z3A01 Less than 8 weeks gestation of pregnancy: Secondary | ICD-10-CM | POA: Diagnosis not present

## 2019-03-30 DIAGNOSIS — R5383 Other fatigue: Secondary | ICD-10-CM | POA: Diagnosis not present

## 2019-03-31 DIAGNOSIS — Z3A01 Less than 8 weeks gestation of pregnancy: Secondary | ICD-10-CM | POA: Diagnosis not present

## 2019-03-31 DIAGNOSIS — K529 Noninfective gastroenteritis and colitis, unspecified: Secondary | ICD-10-CM | POA: Diagnosis not present

## 2019-03-31 DIAGNOSIS — R5383 Other fatigue: Secondary | ICD-10-CM | POA: Diagnosis not present

## 2019-04-07 DIAGNOSIS — L509 Urticaria, unspecified: Secondary | ICD-10-CM | POA: Diagnosis not present

## 2019-04-09 DIAGNOSIS — Z3401 Encounter for supervision of normal first pregnancy, first trimester: Secondary | ICD-10-CM | POA: Diagnosis not present

## 2019-04-14 DIAGNOSIS — Z3401 Encounter for supervision of normal first pregnancy, first trimester: Secondary | ICD-10-CM | POA: Diagnosis not present

## 2019-04-14 DIAGNOSIS — Z3687 Encounter for antenatal screening for uncertain dates: Secondary | ICD-10-CM | POA: Diagnosis not present

## 2019-04-14 DIAGNOSIS — O99211 Obesity complicating pregnancy, first trimester: Secondary | ICD-10-CM | POA: Diagnosis not present

## 2019-05-13 DIAGNOSIS — Z3490 Encounter for supervision of normal pregnancy, unspecified, unspecified trimester: Secondary | ICD-10-CM | POA: Diagnosis not present

## 2019-05-13 DIAGNOSIS — Z113 Encounter for screening for infections with a predominantly sexual mode of transmission: Secondary | ICD-10-CM | POA: Diagnosis not present

## 2019-05-19 DIAGNOSIS — Z3682 Encounter for antenatal screening for nuchal translucency: Secondary | ICD-10-CM | POA: Diagnosis not present

## 2019-05-19 DIAGNOSIS — Z36 Encounter for antenatal screening for chromosomal anomalies: Secondary | ICD-10-CM | POA: Diagnosis not present

## 2019-06-09 DIAGNOSIS — Z363 Encounter for antenatal screening for malformations: Secondary | ICD-10-CM | POA: Diagnosis not present

## 2019-07-09 DIAGNOSIS — Z363 Encounter for antenatal screening for malformations: Secondary | ICD-10-CM | POA: Diagnosis not present

## 2019-07-22 DIAGNOSIS — M545 Low back pain: Secondary | ICD-10-CM | POA: Diagnosis not present

## 2019-07-23 DIAGNOSIS — M545 Low back pain: Secondary | ICD-10-CM | POA: Diagnosis not present

## 2019-07-23 DIAGNOSIS — Z3A22 22 weeks gestation of pregnancy: Secondary | ICD-10-CM | POA: Diagnosis not present

## 2019-09-01 DIAGNOSIS — Z3402 Encounter for supervision of normal first pregnancy, second trimester: Secondary | ICD-10-CM | POA: Diagnosis not present

## 2019-09-01 DIAGNOSIS — Z23 Encounter for immunization: Secondary | ICD-10-CM | POA: Diagnosis not present

## 2019-10-18 DIAGNOSIS — R809 Proteinuria, unspecified: Secondary | ICD-10-CM | POA: Diagnosis not present

## 2019-10-18 DIAGNOSIS — R600 Localized edema: Secondary | ICD-10-CM | POA: Diagnosis not present

## 2019-10-19 DIAGNOSIS — Z3A35 35 weeks gestation of pregnancy: Secondary | ICD-10-CM | POA: Diagnosis not present

## 2019-10-19 DIAGNOSIS — R809 Proteinuria, unspecified: Secondary | ICD-10-CM | POA: Diagnosis not present

## 2019-10-29 DIAGNOSIS — Z113 Encounter for screening for infections with a predominantly sexual mode of transmission: Secondary | ICD-10-CM | POA: Diagnosis not present

## 2019-10-29 DIAGNOSIS — Z3483 Encounter for supervision of other normal pregnancy, third trimester: Secondary | ICD-10-CM | POA: Diagnosis not present

## 2019-10-29 DIAGNOSIS — Z3685 Encounter for antenatal screening for Streptococcus B: Secondary | ICD-10-CM | POA: Diagnosis not present

## 2019-10-29 DIAGNOSIS — O26843 Uterine size-date discrepancy, third trimester: Secondary | ICD-10-CM | POA: Diagnosis not present

## 2019-11-23 DIAGNOSIS — O1403 Mild to moderate pre-eclampsia, third trimester: Secondary | ICD-10-CM | POA: Diagnosis not present

## 2019-11-23 DIAGNOSIS — Z3A33 33 weeks gestation of pregnancy: Secondary | ICD-10-CM | POA: Diagnosis not present

## 2019-11-23 DIAGNOSIS — Z20822 Contact with and (suspected) exposure to covid-19: Secondary | ICD-10-CM | POA: Diagnosis not present

## 2019-11-23 DIAGNOSIS — Z888 Allergy status to other drugs, medicaments and biological substances status: Secondary | ICD-10-CM | POA: Diagnosis not present

## 2019-11-23 DIAGNOSIS — O163 Unspecified maternal hypertension, third trimester: Secondary | ICD-10-CM | POA: Diagnosis not present

## 2019-11-23 DIAGNOSIS — Z23 Encounter for immunization: Secondary | ICD-10-CM | POA: Diagnosis not present

## 2019-11-23 DIAGNOSIS — O1404 Mild to moderate pre-eclampsia, complicating childbirth: Secondary | ICD-10-CM | POA: Diagnosis not present

## 2019-11-23 DIAGNOSIS — Z3A40 40 weeks gestation of pregnancy: Secondary | ICD-10-CM | POA: Diagnosis not present

## 2019-11-23 DIAGNOSIS — O480 Post-term pregnancy: Secondary | ICD-10-CM | POA: Diagnosis not present

## 2020-01-06 DIAGNOSIS — Z3202 Encounter for pregnancy test, result negative: Secondary | ICD-10-CM | POA: Diagnosis not present

## 2020-01-06 DIAGNOSIS — Z01419 Encounter for gynecological examination (general) (routine) without abnormal findings: Secondary | ICD-10-CM | POA: Diagnosis not present

## 2020-01-06 DIAGNOSIS — Z130 Encounter for screening for diseases of the blood and blood-forming organs and certain disorders involving the immune mechanism: Secondary | ICD-10-CM | POA: Diagnosis not present

## 2020-05-15 DIAGNOSIS — U071 COVID-19: Secondary | ICD-10-CM | POA: Diagnosis not present

## 2020-05-15 DIAGNOSIS — J029 Acute pharyngitis, unspecified: Secondary | ICD-10-CM | POA: Diagnosis not present

## 2020-05-15 DIAGNOSIS — J069 Acute upper respiratory infection, unspecified: Secondary | ICD-10-CM | POA: Diagnosis not present

## 2020-07-11 DIAGNOSIS — Z3041 Encounter for surveillance of contraceptive pills: Secondary | ICD-10-CM | POA: Diagnosis not present

## 2020-07-11 DIAGNOSIS — Z7689 Persons encountering health services in other specified circumstances: Secondary | ICD-10-CM | POA: Diagnosis not present

## 2020-07-11 DIAGNOSIS — Z136 Encounter for screening for cardiovascular disorders: Secondary | ICD-10-CM | POA: Diagnosis not present
